# Patient Record
Sex: Female | Born: 1985 | Race: White | Hispanic: No | Marital: Single | Smoking: Current every day smoker
Health system: Southern US, Community
[De-identification: ages and names within clinical notes are randomized; demographics above are authoritative.]

---

## 2016-07-10 ENCOUNTER — Emergency Department (HOSPITAL_COMMUNITY)
Admission: EM | Admit: 2016-07-10 | Discharge: 2016-07-11 | Disposition: A | Discharge status: Home / Self Care | Payer: BLUE CROSS/BLUE SHIELD | Attending: Emergency Medicine | Admitting: Emergency Medicine

## 2016-07-10 DIAGNOSIS — Z791 Long term (current) use of non-steroidal anti-inflammatories (NSAID): Secondary | ICD-10-CM | POA: Diagnosis not present

## 2016-07-10 DIAGNOSIS — S9031XA Contusion of right foot, initial encounter: Secondary | ICD-10-CM

## 2016-07-10 DIAGNOSIS — S93401A Sprain of unspecified ligament of right ankle, initial encounter: Secondary | ICD-10-CM | POA: Diagnosis not present

## 2016-07-10 DIAGNOSIS — Y999 Unspecified external cause status: Secondary | ICD-10-CM | POA: Insufficient documentation

## 2016-07-10 DIAGNOSIS — W231XXA Caught, crushed, jammed, or pinched between stationary objects, initial encounter: Secondary | ICD-10-CM | POA: Diagnosis not present

## 2016-07-10 DIAGNOSIS — S99921A Unspecified injury of right foot, initial encounter: Secondary | ICD-10-CM | POA: Diagnosis present

## 2016-07-10 DIAGNOSIS — Y939 Activity, unspecified: Secondary | ICD-10-CM | POA: Diagnosis not present

## 2016-07-10 DIAGNOSIS — Y92009 Unspecified place in unspecified non-institutional (private) residence as the place of occurrence of the external cause: Secondary | ICD-10-CM | POA: Diagnosis not present

## 2016-07-10 DIAGNOSIS — F17200 Nicotine dependence, unspecified, uncomplicated: Secondary | ICD-10-CM | POA: Insufficient documentation

## 2016-07-10 NOTE — ED Triage Notes (Signed)
Pt injured her right foot when it got caught in between some furniture that she was moving, happened Sunday.

## 2016-07-11 ENCOUNTER — Emergency Department (HOSPITAL_COMMUNITY): Payer: BLUE CROSS/BLUE SHIELD

## 2016-07-11 ENCOUNTER — Encounter (HOSPITAL_COMMUNITY): Payer: Self-pay

## 2016-07-11 MED ORDER — IBUPROFEN 800 MG PO TABS: 800.0000 mg | ORAL_TABLET | Freq: Three times a day (TID) | ORAL | 0 refills | Status: AC | PRN

## 2016-07-11 MED ORDER — IBUPROFEN 800 MG PO TABS
800.0000 mg | ORAL_TABLET | Freq: Once | ORAL | Status: AC
  Administered 2016-07-11: 800 mg via ORAL
  Filled 2016-07-11: qty 1

## 2016-07-11 MED ORDER — HYDROCODONE-ACETAMINOPHEN 5-325 MG PO TABS: 1.0000 | ORAL_TABLET | Freq: Four times a day (QID) | ORAL | 0 refills | Status: AC | PRN

## 2016-07-11 NOTE — ED Notes (Signed)
Patient was given a prepackage of hydrocodone-acetaminophen quantity six and verbal instructions on use.

## 2016-07-11 NOTE — ED Provider Notes (Signed)
TIME SEEN: 12:52  CHIEF COMPLAINT: ankle pain  HPI: HPI Comments:  Ashley Bennett is a 30 y.o. female who presents to the Emergency Department s/p injury 07/06/16 complaining of pain in her right foot and ankle.  She was moving a desk when she injured her right foot. States her foot was stuck under the desk and she twisted it.  She describes the pain as pulling, aching, throbbing. She also notes her pain is worse with weight bearing and movement.  She's been taking ibuprofen and states that it helps the inflamation, but not the pain.  Has crutches that she has been using at home. Did not fall to the ground or hit her head.  ROS: See HPI Constitutional: no fever  Eyes: no drainage  ENT: no runny nose   Cardiovascular:  no chest pain  Resp: no SOB  GI: no vomiting GU: no dysuria Integumentary: no rash  Allergy: no hives  Musculoskeletal: Right ankle and foot pain and swelling Neurological: no slurred speech ROS otherwise negative  PAST MEDICAL HISTORY/PAST SURGICAL HISTORY:  History reviewed. No pertinent past medical history.  MEDICATIONS:  Prior to Admission medications   Medication Sig Start Date End Date Taking? Authorizing Provider  ibuprofen (ADVIL,MOTRIN) 800 MG tablet Take 800 mg by mouth every 8 (eight) hours as needed.   Yes Historical Provider, MD    ALLERGIES:  No Known Allergies  SOCIAL HISTORY:  Social History  Substance Use Topics  . Smoking status: Current Every Day Smoker  . Smokeless tobacco: Never Used  . Alcohol use Yes    FAMILY HISTORY: No family history on file.  EXAM: BP 127/78 (BP Location: Left Arm)   Pulse (!) 57   Temp 97.7 F (36.5 C) (Oral)   Resp 16   Ht 5\' 9"  (1.753 m)   Wt 220 lb (99.8 kg)   LMP 06/17/2016   SpO2 100%   BMI 32.49 kg/m  CONSTITUTIONAL: Alert and oriented and responds appropriately to questions. Well-appearing; well-nourished HEAD: Normocephalic EYES: Conjunctivae clear, PERRL ENT: normal nose; no rhinorrhea;  moist mucous membranes NECK: Supple, no meningismus, no LAD  CARD: RRR; S1 and S2 appreciated; no murmurs, no clicks, no rubs, no gallops RESP: Normal chest excursion without splinting or tachypnea; breath sounds clear and equal bilaterally; no wheezes, no rhonchi, no rales, no hypoxia or respiratory distress, speaking full sentences ABD/GI: Normal bowel sounds; non-distended; soft, non-tender, no rebound, no guarding, no peritoneal signs BACK:  The back appears normal and is non-tender to palpation, there is no CVA tenderness EXT: Patient is tender to palpation diffusely over the right ankle and right foot without ligamentous laxity. There is associated swelling or ecchymosis noted to the dorsal and lateral foot and lateral malleolus. No obvious bony deformity. 2+ DP pulses bilaterally. No tenderness over the proximal fibular head on the left side. Normal ROM in all joints; otherwise extremities are non-tender to palpation; no edema; normal capillary refill; no cyanosis, no calf tenderness or swelling    SKIN: Normal color for age and race; warm; no rash NEURO: Moves all extremities equally, sensation to light touch intact diffusely, cranial nerves II through XII intact PSYCH: The patient's mood and manner are appropriate. Grooming and personal hygiene are appropriate.  MEDICAL DECISION MAKING: Patient here with right foot and ankle injury. X-ray show no acute fracture or dislocation. Suspect sprain, contusion. Have recommended rest, elevation, ice. We'll discharge with anti-inflammatories, pain medication. Have provided her with a work note. No other sign of injury  on exam. Neurovascularly intact distally. Have given her Dr. Mort SawyersHarrison's follow-up information if symptoms are not improving in the next 1-2 weeks.  At this time, I do not feel there is any life-threatening condition present. I have reviewed and discussed all results (EKG, imaging, lab, urine as appropriate), exam findings with  patient/family. I have reviewed nursing notes and appropriate previous records.  I feel the patient is safe to be discharged home without further emergent workup and can continue workup as an outpatient as needed. Discussed usual and customary return precautions. Patient/family verbalize understanding and are comfortable with this plan.  Outpatient follow-up has been provided. All questions have been answered.   I personally performed the services described in this documentation, which was scribed in my presence. The recorded information has been reviewed and is accurate.     Layla MawKristen N Ward, DO 07/11/16 [phone]

## 2016-07-15 MED FILL — Hydrocodone-Acetaminophen Tab 5-325 MG: ORAL | Qty: 6 | Status: AC

## 2017-10-12 IMAGING — DX DG FOOT COMPLETE 3+V*R*
3 series · 3 of 3 positions shown · non-contrast
Comparison: None.

CLINICAL DATA: 29 y/o F; dropped a piece of furniture on top of
foot 5 days ago with pain.

EXAM:
RIGHT FOOT COMPLETE - 3+ VIEW

[foot ap]
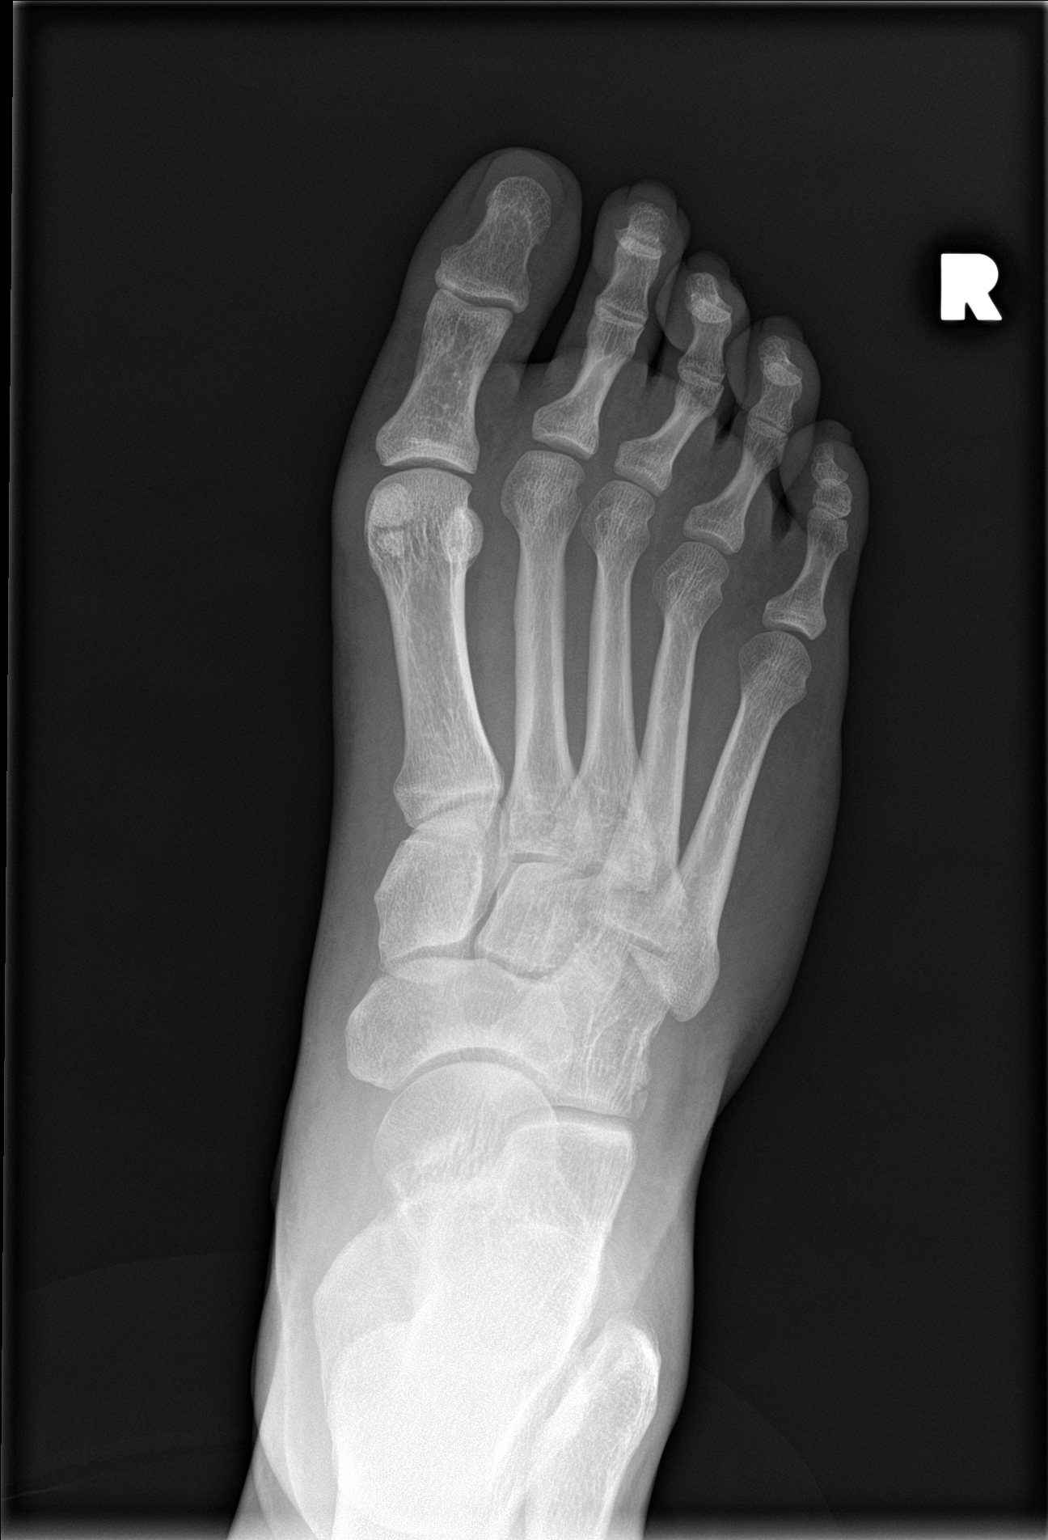

[foot obl]
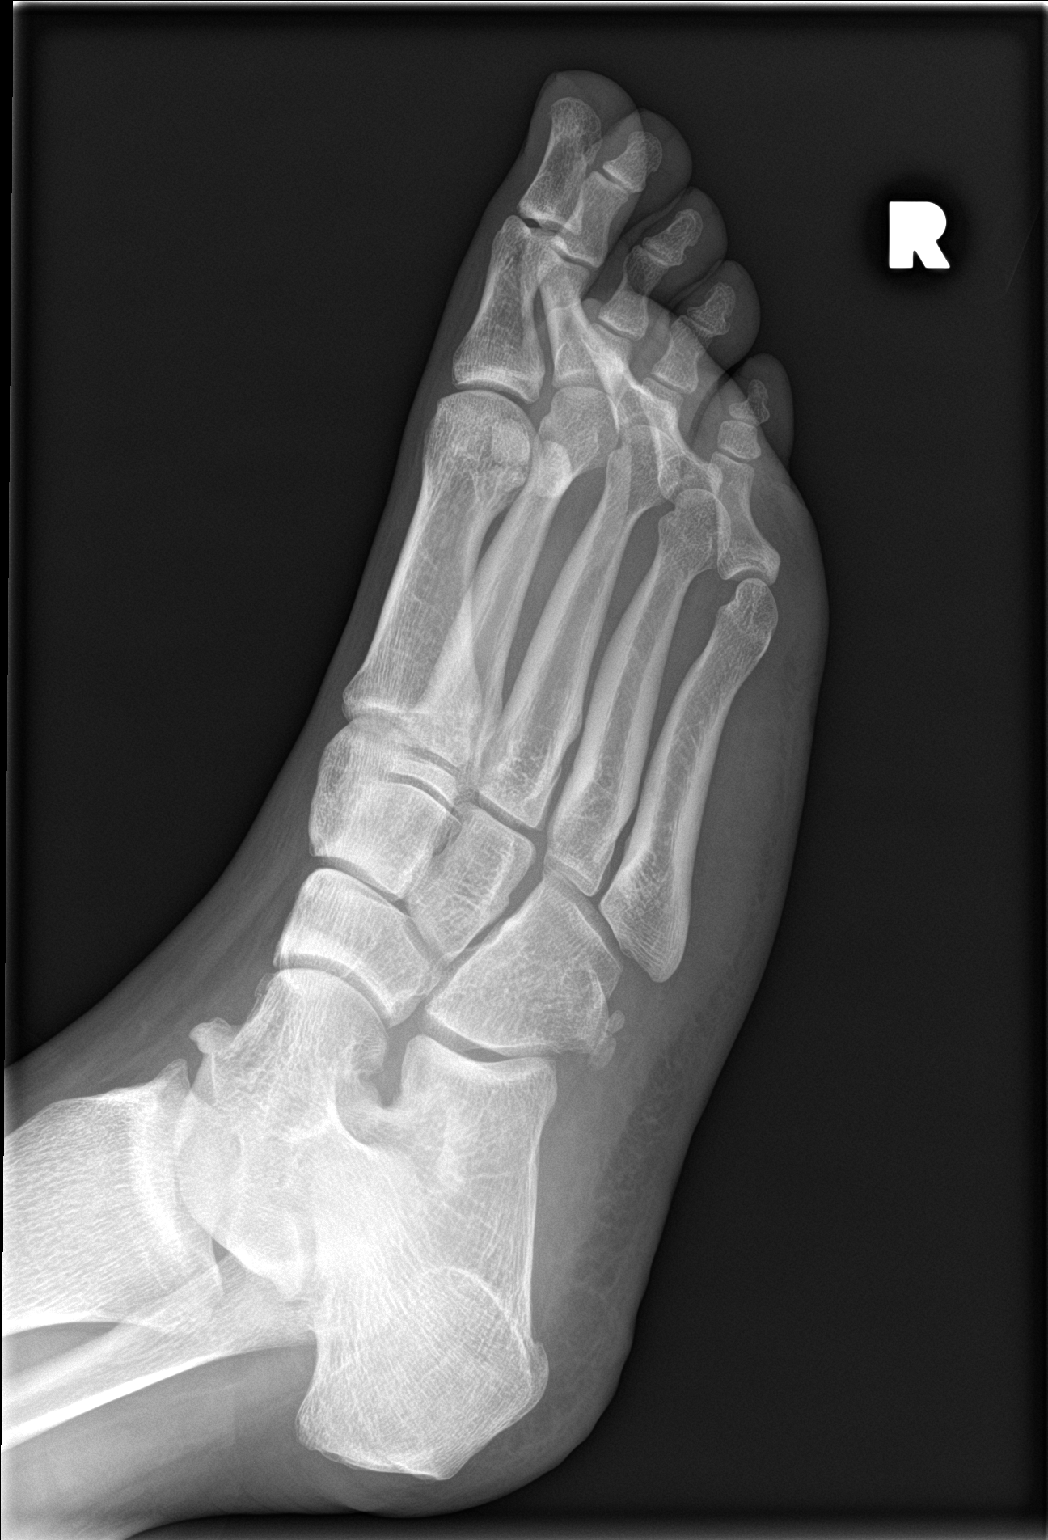

[foot lat]
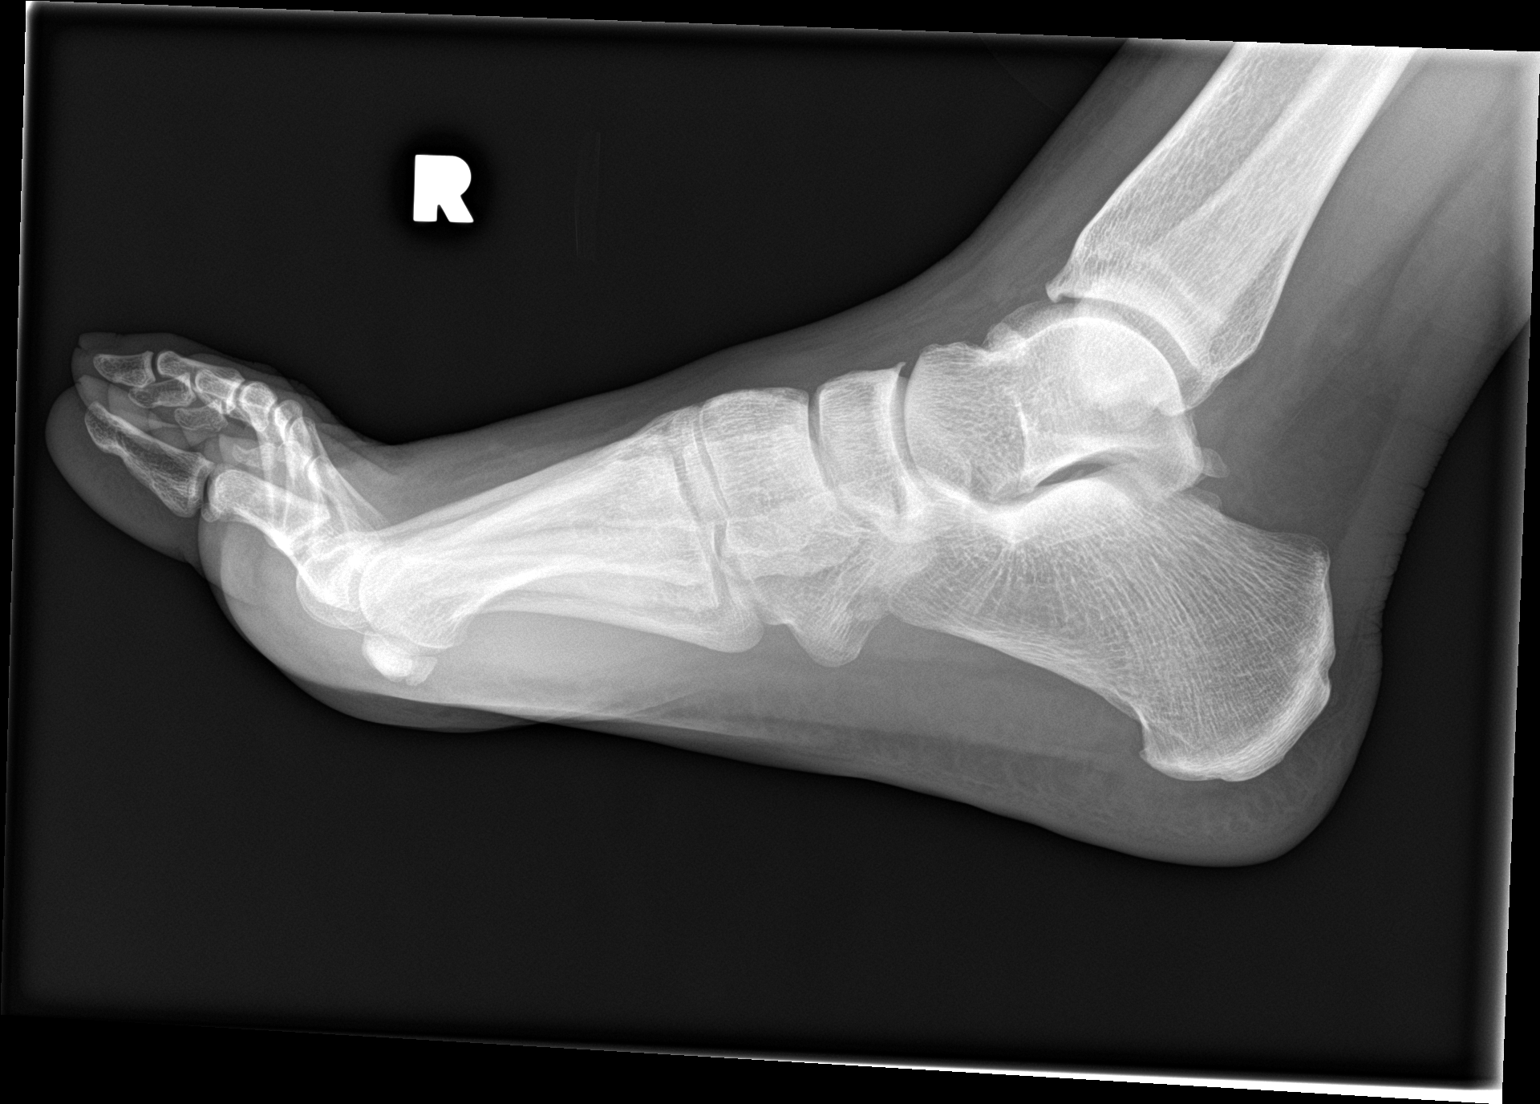

[3 of 3 positions shown; findings below may reference images not displayed]

FINDINGS: No acute fracture or dislocation is identified. Lisfranc alignment
is preserved. Productive changes of the anterior tibial plafond and
talar neck may represent anterior ankle impingement.
IMPRESSION: 1. No acute fracture or dislocation is identified.
2. Findings suggestive of anterior ankle impingement.

By: Deonte Tetteh M.D.
